# Patient Record
Sex: Male | Born: 1995 | Race: White | Hispanic: No | Marital: Single | Smoking: Never smoker
Health system: Southern US, Community
[De-identification: ages and names within clinical notes are randomized; demographics above are authoritative.]

## PROBLEM LIST (undated history)

## (undated) DIAGNOSIS — B019 Varicella without complication: Secondary | ICD-10-CM

## (undated) DIAGNOSIS — F840 Autistic disorder: Secondary | ICD-10-CM

## (undated) HISTORY — PX: APPENDECTOMY: SHX54

## (undated) HISTORY — DX: Varicella without complication: B01.9

## (undated) HISTORY — PX: MYRINGOTOMY: SHX2060

## (undated) HISTORY — DX: Autistic disorder: F84.0

---
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
## 2015-11-01 ENCOUNTER — Ambulatory Visit: Payer: 59

## 2015-11-01 ENCOUNTER — Ambulatory Visit (INDEPENDENT_AMBULATORY_CARE_PROVIDER_SITE_OTHER): Payer: 59

## 2015-11-01 DIAGNOSIS — Z23 Encounter for immunization: Secondary | ICD-10-CM | POA: Diagnosis not present

## 2016-06-29 ENCOUNTER — Ambulatory Visit (INDEPENDENT_AMBULATORY_CARE_PROVIDER_SITE_OTHER): Payer: 59 | Admitting: Psychology

## 2016-06-29 DIAGNOSIS — F840 Autistic disorder: Secondary | ICD-10-CM

## 2016-06-29 DIAGNOSIS — F4322 Adjustment disorder with anxiety: Secondary | ICD-10-CM | POA: Diagnosis not present

## 2016-07-17 ENCOUNTER — Ambulatory Visit (INDEPENDENT_AMBULATORY_CARE_PROVIDER_SITE_OTHER): Payer: 59 | Admitting: Psychology

## 2016-07-17 DIAGNOSIS — F4322 Adjustment disorder with anxiety: Secondary | ICD-10-CM

## 2016-07-17 DIAGNOSIS — F840 Autistic disorder: Secondary | ICD-10-CM

## 2016-08-02 ENCOUNTER — Ambulatory Visit (INDEPENDENT_AMBULATORY_CARE_PROVIDER_SITE_OTHER): Payer: 59 | Admitting: Psychology

## 2016-08-02 DIAGNOSIS — F840 Autistic disorder: Secondary | ICD-10-CM

## 2016-08-02 DIAGNOSIS — F4322 Adjustment disorder with anxiety: Secondary | ICD-10-CM

## 2016-08-16 ENCOUNTER — Ambulatory Visit (INDEPENDENT_AMBULATORY_CARE_PROVIDER_SITE_OTHER): Payer: 59 | Admitting: Psychology

## 2016-08-16 DIAGNOSIS — F840 Autistic disorder: Secondary | ICD-10-CM

## 2016-08-16 DIAGNOSIS — F4322 Adjustment disorder with anxiety: Secondary | ICD-10-CM | POA: Diagnosis not present

## 2016-08-22 ENCOUNTER — Ambulatory Visit (INDEPENDENT_AMBULATORY_CARE_PROVIDER_SITE_OTHER): Payer: 59 | Admitting: Psychology

## 2016-08-22 DIAGNOSIS — F840 Autistic disorder: Secondary | ICD-10-CM | POA: Diagnosis not present

## 2016-08-22 DIAGNOSIS — F4323 Adjustment disorder with mixed anxiety and depressed mood: Secondary | ICD-10-CM

## 2016-08-30 ENCOUNTER — Ambulatory Visit (INDEPENDENT_AMBULATORY_CARE_PROVIDER_SITE_OTHER): Payer: 59 | Admitting: Psychology

## 2016-08-30 DIAGNOSIS — F840 Autistic disorder: Secondary | ICD-10-CM

## 2016-09-13 ENCOUNTER — Ambulatory Visit: Payer: Self-pay | Admitting: Psychology

## 2016-09-18 ENCOUNTER — Other Ambulatory Visit (INDEPENDENT_AMBULATORY_CARE_PROVIDER_SITE_OTHER): Payer: 59

## 2016-09-18 ENCOUNTER — Ambulatory Visit (INDEPENDENT_AMBULATORY_CARE_PROVIDER_SITE_OTHER): Payer: 59 | Admitting: Family

## 2016-09-18 ENCOUNTER — Encounter: Payer: Self-pay | Admitting: Family

## 2016-09-18 VITALS — BP 116/82 | HR 81 | Ht 69.0 in | Wt 180.4 lb

## 2016-09-18 DIAGNOSIS — Z0000 Encounter for general adult medical examination without abnormal findings: Secondary | ICD-10-CM

## 2016-09-18 LAB — LIPID PANEL
CHOL/HDL RATIO: 4
Cholesterol: 138 mg/dL (ref 0–200)
HDL: 32.6 mg/dL — ABNORMAL LOW (ref >39.00)
LDL CALC: 87 mg/dL (ref 0–99)
NONHDL: 105.28
TRIGLYCERIDES: 90 mg/dL (ref 0.0–149.0)
VLDL: 18 mg/dL (ref 0.0–40.0)

## 2016-09-18 LAB — COMPREHENSIVE METABOLIC PANEL
ALT: 21 U/L (ref 0–53)
AST: 17 U/L (ref 0–37)
Albumin: 4.7 g/dL (ref 3.5–5.2)
Alkaline Phosphatase: 66 U/L (ref 39–117)
BUN: 10 mg/dL (ref 6–23)
CALCIUM: 9.8 mg/dL (ref 8.4–10.5)
CHLORIDE: 102 meq/L (ref 96–112)
CO2: 29 meq/L (ref 19–32)
CREATININE: 0.96 mg/dL (ref 0.40–1.50)
GFR: 104.94 mL/min (ref >60.00)
GLUCOSE: 97 mg/dL (ref 70–99)
Potassium: 4.6 mEq/L (ref 3.5–5.1)
SODIUM: 137 meq/L (ref 135–145)
Total Bilirubin: 0.7 mg/dL (ref 0.2–1.2)
Total Protein: 7.2 g/dL (ref 6.0–8.3)

## 2016-09-18 LAB — CBC
HCT: 49.9 % (ref 39.0–52.0)
Hemoglobin: 17.5 g/dL — ABNORMAL HIGH (ref 13.0–17.0)
MCHC: 35.1 g/dL (ref 30.0–36.0)
MCV: 81.8 fl (ref 78.0–100.0)
PLATELETS: 215 10*3/uL (ref 150.0–400.0)
RBC: 6.1 Mil/uL — ABNORMAL HIGH (ref 4.22–5.81)
RDW: 13.6 % (ref 11.5–15.5)
WBC: 6.7 10*3/uL (ref 4.0–10.5)

## 2016-09-18 NOTE — Patient Instructions (Signed)
Thank you for choosing [facility].  SUMMARY AND INSTRUCTIONS:  Continue to work on physical activity.   Labs:  Please stop by the lab on the lower level of the building for your blood work. Your results will be released to MyChart (or called to you) after review, usually within 72 hours after test completion. If any changes need to be made, you will be notified at that same time.  1.) The lab is open from 7:30am to 5:30 pm Monday-Friday 2.) No appointment is necessary 3.) Fasting (if needed) is 6-8 hours after food and drink; black coffee and water are okay   Follow up:  If your symptoms worsen or fail to improve, please contact our office for further instruction, or in case of emergency go directly to the emergency room at the closest medical facility.     Health Maintenance, Male A healthy lifestyle and preventive care is important for your health and wellness. Ask your health care provider about what schedule of regular examinations is right for you. What should I know about weight and diet? Eat a Healthy Diet  Eat plenty of vegetables, fruits, whole grains, low-fat dairy products, and lean protein.  Do not eat a lot of foods high in solid fats, added sugars, or salt.  Maintain a Healthy Weight Regular exercise can help you achieve or maintain a healthy weight. You should:  Do at least 150 minutes of exercise each week. The exercise should increase your heart rate and make you sweat (moderate-intensity exercise).  Do strength-training exercises at least twice a week.  Watch Your Levels of Cholesterol and Blood Lipids  Have your blood tested for lipids and cholesterol every 5 years starting at 21 years of age. If you are at high risk for heart disease, you should start having your blood tested when you are 21 years old. You may need to have your cholesterol levels checked more often if: ? Your lipid or cholesterol levels are high. ? You are older than 21 years of  age. ? You are at high risk for heart disease.  What should I know about cancer screening? Many types of cancers can be detected early and may often be prevented. Lung Cancer  You should be screened every year for lung cancer if: ? You are a current smoker who has smoked for at least 30 years. ? You are a former smoker who has quit within the past 15 years.  Talk to your health care provider about your screening options, when you should start screening, and how often you should be screened.  Colorectal Cancer  Routine colorectal cancer screening usually begins at 21 years of age and should be repeated every 5-10 years until you are 21 years old. You may need to be screened more often if early forms of precancerous polyps or small growths are found. Your health care provider may recommend screening at an earlier age if you have risk factors for colon cancer.  Your health care provider may recommend using home test kits to check for hidden blood in the stool.  A small camera at the end of a tube can be used to examine your colon (sigmoidoscopy or colonoscopy). This checks for the earliest forms of colorectal cancer.  Prostate and Testicular Cancer  Depending on your age and overall health, your health care provider may do certain tests to screen for prostate and testicular cancer.  Talk to your health care provider about any symptoms or concerns you have about testicular  or prostate cancer.  Skin Cancer  Check your skin from head to toe regularly.  Tell your health care provider about any new moles or changes in moles, especially if: ? There is a change in a mole's size, shape, or color. ? You have a mole that is larger than a pencil eraser.  Always use sunscreen. Apply sunscreen liberally and repeat throughout the day.  Protect yourself by wearing long sleeves, pants, a wide-brimmed hat, and sunglasses when outside.  What should I know about heart disease, diabetes, and high  blood pressure?  If you are 34-56 years of age, have your blood pressure checked every 3-5 years. If you are 29 years of age or older, have your blood pressure checked every year. You should have your blood pressure measured twice-once when you are at a hospital or clinic, and once when you are not at a hospital or clinic. Record the average of the two measurements. To check your blood pressure when you are not at a hospital or clinic, you can use: ? An automated blood pressure machine at a pharmacy. ? A home blood pressure monitor.  Talk to your health care provider about your target blood pressure.  If you are between 33-74 years old, ask your health care provider if you should take aspirin to prevent heart disease.  Have regular diabetes screenings by checking your fasting blood sugar level. ? If you are at a normal weight and have a low risk for diabetes, have this test once every three years after the age of 39. ? If you are overweight and have a high risk for diabetes, consider being tested at a younger age or more often.  A one-time screening for abdominal aortic aneurysm (AAA) by ultrasound is recommended for men aged 34-75 years who are current or former smokers. What should I know about preventing infection? Hepatitis B If you have a higher risk for hepatitis B, you should be screened for this virus. Talk with your health care provider to find out if you are at risk for hepatitis B infection. Hepatitis C Blood testing is recommended for:  Everyone born from [birth year] through 1965.  Anyone with known risk factors for hepatitis C.  Sexually Transmitted Diseases (STDs)  You should be screened each year for STDs including gonorrhea and chlamydia if: ? You are sexually active and are younger than 21 years of age. ? You are older than 21 years of age and your health care provider tells you that you are at risk for this type of infection. ? Your sexual activity has changed since you were  last screened and you are at an increased risk for chlamydia or gonorrhea. Ask your health care provider if you are at risk.  Talk with your health care provider about whether you are at high risk of being infected with HIV. Your health care provider may recommend a prescription medicine to help prevent HIV infection.  What else can I do?  Schedule regular health, dental, and eye exams.  Stay current with your vaccines (immunizations).  Do not use any tobacco products, such as cigarettes, chewing tobacco, and e-cigarettes. If you need help quitting, ask your health care provider.  Limit alcohol intake to no more than 2 drinks per day. One drink equals 12 ounces of beer, 5 ounces of wine, or 1 ounces of hard liquor.  Do not use street drugs.  Do not share needles.  Ask your health care provider for help if you need support  or information about quitting drugs.  Tell your health care provider if you often feel depressed.  Tell your health care provider if you have ever been abused or do not feel safe at home. This information is not intended to replace advice given to you by your health care provider. Make sure you discuss any questions you have with your health care provider. Document Released: 09/23/2007 Document Revised: 11/24/2015 Document Reviewed: 12/29/2014 Elsevier Interactive Patient Education  [employer].

## 2016-09-18 NOTE — Assessment & Plan Note (Signed)
1) Anticipatory Guidance: Discussed importance of wearing a seatbelt while driving and not texting while driving; changing batteries in smoke detector at least once annually; wearing suntan lotion when outside; eating a balanced and moderate diet; getting physical activity at least 30 minutes per day.  2) Immunizations / Screenings / Labs:   All immunizations are up-to-date per recommendations. Due for a vision exam encouraged to be completed independently. All other screenings are up-to-date per recommendations. Obtain CBC, CMET, and lipid profile.    Overall well exam with minimal risk factors for cardiovascular disease noted on physical exam. Exercises on a regular basis and is of good body weight.  Recommend optimizing nutrition through an intake that is moderate, balance, and varied. Continue other healthy lifestyle behaviors and choices. Follow-up prevention exam in 1 year. Follow-up office visit pending blood work as needed.

## 2016-09-18 NOTE — Progress Notes (Signed)
Subjective:    Patient ID: Nathan Lawson, male    DOB: 04-26-1995, 21 y.o.   MRN: [id number]  Chief Complaint  Patient presents with  . Annual Exam    HPI:  Nathan Lawson is a 21 y.o. male who presents today for an annual wellness visit.   1) Health Maintenance -   Diet - Averages about 2-3 meals per day consisting of a regular diet; Caffeine intake 1-2 cups per day  Exercise - Physical labor and working to increase exercise.   Wt Readings from Last 3 Encounters:  09/18/16 180 lb 6.4 oz (81.8 kg)  11/02/14 179 lb (81.2 kg) (81 %, Z= 0.89)*  08/20/14 180 lb 3.2 oz (81.7 kg) (83 %, Z= 0.95)*   * Growth percentiles are based on CDC 2-20 Years data.     2) Preventative Exams / Immunizations:  Dental -- Up to date  Vision -- Due for exam    Health Maintenance  Topic Date Due  . HIV Screening  07/27/2010  . INFLUENZA VACCINE  11/08/2016  . TETANUS/TDAP  01/05/2022    Immunization History  Administered Date(s) Administered  . DTaP 09/28/1995, 01/23/1996, 03/15/1996, 02/23/1997, 11/21/2000  . HPV Quadrivalent 08/20/2009, 09/08/2010, 09/07/2011  . Hepatitis A 05/19/2005, 11/17/2005  . Hepatitis B, adult 11/02/2014, 12/01/2014, 11/01/2015  . HiB (PRP-OMP) 09/28/1995, 01/23/1996, 03/15/1996, 02/23/1997  . IPV 11/05/1995, 12/19/1995, 11/21/2000  . Influenza Nasal 05/19/2005  . Influenza-Unspecified 03/31/2015  . MMR 10/27/1996, 11/21/2000  . Meningococcal Conjugate 07/03/2007  . Meningococcal Polysaccharide 09/07/2011  . Tdap 01/06/2012  . Varicella 10/27/1996, 05/19/2005     No Known Allergies   Outpatient Medications Prior to Visit  Medication Sig Dispense Refill  . azithromycin (ZITHROMAX) 250 MG tablet Take 2 tablets for 1 day and then 1 tablet daily for 4 days. 6 tablet 0   No facility-administered medications prior to visit.      Past Medical History:  Diagnosis Date  . Autism   . Chicken pox      Past Surgical History:  Procedure  Laterality Date  . APPENDECTOMY    . MYRINGOTOMY       Family History  Problem Relation Age of Onset  . Depression Mother   . Healthy Father   . Parkinson's disease Paternal Grandfather      Social History   Social History  . Marital status: Single    Spouse name: N/A  . Number of children: 0  . Years of education: 12   Occupational History  . Student    Social History Main Topics  . Smoking status: Never Smoker  . Smokeless tobacco: Never Used  . Alcohol use No  . Drug use: No  . Sexual activity: Not on file   Other Topics Concern  . Not on file   Social History Narrative   Fun: Gaming, reading   Denies religious beliefs effecting health care.       Review of Systems  Constitutional: Denies fever, chills, fatigue, or significant weight gain/loss. HENT: Head: Denies headache or neck pain Ears: Denies changes in hearing, ringing in ears, earache, drainage Nose: Denies discharge, stuffiness, itching, nosebleed, sinus pain Throat: Denies sore throat, hoarseness, dry mouth, sores, thrush Eyes: Denies loss/changes in vision, pain, redness, blurry/double vision, flashing lights Cardiovascular: Denies chest pain/discomfort, tightness, palpitations, shortness of breath with activity, difficulty lying down, swelling, sudden awakening with shortness of breath Respiratory: Denies shortness of breath, cough, sputum production, wheezing Gastrointestinal: Denies dysphasia, heartburn, change in appetite, nausea, change  in bowel habits, rectal bleeding, constipation, diarrhea, yellow skin or eyes Genitourinary: Denies frequency, urgency, burning/pain, blood in urine, incontinence, change in urinary strength. Musculoskeletal: Denies muscle/joint pain, stiffness, back pain, redness or swelling of joints, trauma Skin: Denies rashes, lumps, itching, dryness, color changes, or hair/nail changes Neurological: Denies dizziness, fainting, seizures, weakness, numbness, tingling,  tremor Psychiatric - Denies nervousness, stress, depression or memory loss Endocrine: Denies heat or cold intolerance, sweating, frequent urination, excessive thirst, changes in appetite Hematologic: Denies ease of bruising or bleeding     Objective:     BP 116/82   Pulse 81   Ht 5' 9"  (1.753 m)   Wt 180 lb 6.4 oz (81.8 kg)   SpO2 98%   BMI 26.64 kg/m  Nursing note and vital signs reviewed.  Physical Exam  Constitutional: He is oriented to person, place, and time. He appears well-developed and well-nourished. No distress.  HENT:  Head: Normocephalic.  Right Ear: Hearing, tympanic membrane, external ear and ear canal normal.  Left Ear: Hearing, tympanic membrane, external ear and ear canal normal.  Nose: Nose normal.  Mouth/Throat: Uvula is midline, oropharynx is clear and moist and mucous membranes are normal.  Eyes: Conjunctivae and EOM are normal. Pupils are equal, round, and reactive to light.  Neck: Neck supple. No JVD present. No tracheal deviation present. No thyromegaly present.  Cardiovascular: Normal rate, regular rhythm, normal heart sounds and intact distal pulses.   Pulmonary/Chest: Effort normal and breath sounds normal.  Abdominal: Soft. Bowel sounds are normal. He exhibits no distension and no mass. There is no tenderness. There is no rebound and no guarding.  Musculoskeletal: Normal range of motion. He exhibits no edema or tenderness.  Lymphadenopathy:    He has no cervical adenopathy.  Neurological: He is alert and oriented to person, place, and time. He has normal reflexes. No cranial nerve deficit. He exhibits normal muscle tone. Coordination normal.  Skin: Skin is warm and dry.  Psychiatric: He has a normal mood and affect. His behavior is normal. Judgment and thought content normal.       Assessment & Plan:   Problem List Items Addressed This Visit      Other   Routine general medical examination at a health care facility - Primary    1) Anticipatory  Guidance: Discussed importance of wearing a seatbelt while driving and not texting while driving; changing batteries in smoke detector at least once annually; wearing suntan lotion when outside; eating a balanced and moderate diet; getting physical activity at least 30 minutes per day.  2) Immunizations / Screenings / Labs:   All immunizations are up-to-date per recommendations. Due for a vision exam encouraged to be completed independently. All other screenings are up-to-date per recommendations. Obtain CBC, CMET, and lipid profile.    Overall well exam with minimal risk factors for cardiovascular disease noted on physical exam. Exercises on a regular basis and is of good body weight.  Recommend optimizing nutrition through an intake that is moderate, balance, and varied. Continue other healthy lifestyle behaviors and choices. Follow-up prevention exam in 1 year. Follow-up office visit pending blood work as needed.       Relevant Orders   CBC   Comprehensive metabolic panel   Lipid panel       I have discontinued Mr. Castoro's azithromycin.   Follow-up: Return in about 1 year (around 09/18/2017), or if symptoms worsen or fail to improve.   Mauricio Po, FNP

## 2016-09-27 ENCOUNTER — Ambulatory Visit (INDEPENDENT_AMBULATORY_CARE_PROVIDER_SITE_OTHER): Payer: 59 | Admitting: Psychology

## 2016-09-27 DIAGNOSIS — F840 Autistic disorder: Secondary | ICD-10-CM | POA: Diagnosis not present

## 2016-10-25 ENCOUNTER — Ambulatory Visit: Payer: 59 | Admitting: Psychology

## 2016-11-08 ENCOUNTER — Ambulatory Visit (INDEPENDENT_AMBULATORY_CARE_PROVIDER_SITE_OTHER): Payer: 59 | Admitting: Psychology

## 2016-11-08 DIAGNOSIS — F840 Autistic disorder: Secondary | ICD-10-CM

## 2016-11-22 ENCOUNTER — Ambulatory Visit (INDEPENDENT_AMBULATORY_CARE_PROVIDER_SITE_OTHER): Payer: 59 | Admitting: Psychology

## 2016-11-22 DIAGNOSIS — F840 Autistic disorder: Secondary | ICD-10-CM | POA: Diagnosis not present

## 2016-12-06 ENCOUNTER — Ambulatory Visit (INDEPENDENT_AMBULATORY_CARE_PROVIDER_SITE_OTHER): Payer: 59 | Admitting: Psychology

## 2016-12-06 DIAGNOSIS — F840 Autistic disorder: Secondary | ICD-10-CM | POA: Diagnosis not present

## 2017-01-03 ENCOUNTER — Ambulatory Visit (INDEPENDENT_AMBULATORY_CARE_PROVIDER_SITE_OTHER): Payer: 59 | Admitting: Psychology

## 2017-01-03 DIAGNOSIS — F840 Autistic disorder: Secondary | ICD-10-CM

## 2017-01-31 ENCOUNTER — Ambulatory Visit: Payer: Self-pay | Admitting: Psychology

## 2017-02-28 ENCOUNTER — Ambulatory Visit (INDEPENDENT_AMBULATORY_CARE_PROVIDER_SITE_OTHER): Payer: 59 | Admitting: Psychology

## 2017-02-28 DIAGNOSIS — F840 Autistic disorder: Secondary | ICD-10-CM

## 2017-03-28 ENCOUNTER — Ambulatory Visit: Payer: 59 | Admitting: Psychology

## 2017-04-25 ENCOUNTER — Ambulatory Visit: Payer: 59 | Admitting: Psychology

## 2017-05-23 ENCOUNTER — Ambulatory Visit: Payer: 59 | Admitting: Psychology

## 2017-05-24 ENCOUNTER — Ambulatory Visit (INDEPENDENT_AMBULATORY_CARE_PROVIDER_SITE_OTHER): Payer: 59 | Admitting: Psychology

## 2017-05-24 DIAGNOSIS — F840 Autistic disorder: Secondary | ICD-10-CM | POA: Diagnosis not present

## 2017-05-31 ENCOUNTER — Ambulatory Visit: Payer: 59 | Admitting: Psychology

## 2017-06-01 ENCOUNTER — Ambulatory Visit: Payer: 59 | Admitting: Psychology

## 2017-07-02 ENCOUNTER — Ambulatory Visit (INDEPENDENT_AMBULATORY_CARE_PROVIDER_SITE_OTHER): Payer: 59 | Admitting: Psychology

## 2017-07-02 DIAGNOSIS — F900 Attention-deficit hyperactivity disorder, predominantly inattentive type: Secondary | ICD-10-CM | POA: Diagnosis not present

## 2017-07-02 DIAGNOSIS — F840 Autistic disorder: Secondary | ICD-10-CM | POA: Diagnosis not present

## 2017-07-03 ENCOUNTER — Ambulatory Visit (INDEPENDENT_AMBULATORY_CARE_PROVIDER_SITE_OTHER): Payer: 59 | Admitting: Psychology

## 2017-07-03 DIAGNOSIS — F812 Mathematics disorder: Secondary | ICD-10-CM

## 2017-07-03 DIAGNOSIS — F902 Attention-deficit hyperactivity disorder, combined type: Secondary | ICD-10-CM | POA: Diagnosis not present

## 2017-07-03 DIAGNOSIS — F840 Autistic disorder: Secondary | ICD-10-CM | POA: Diagnosis not present

## 2017-07-04 ENCOUNTER — Ambulatory Visit (INDEPENDENT_AMBULATORY_CARE_PROVIDER_SITE_OTHER): Payer: 59 | Admitting: Psychology

## 2017-07-04 DIAGNOSIS — F902 Attention-deficit hyperactivity disorder, combined type: Secondary | ICD-10-CM | POA: Diagnosis not present

## 2017-07-04 DIAGNOSIS — F840 Autistic disorder: Secondary | ICD-10-CM | POA: Diagnosis not present

## 2017-07-04 DIAGNOSIS — F812 Mathematics disorder: Secondary | ICD-10-CM | POA: Diagnosis not present

## 2017-08-02 ENCOUNTER — Ambulatory Visit (INDEPENDENT_AMBULATORY_CARE_PROVIDER_SITE_OTHER): Payer: 59 | Admitting: Psychology

## 2017-08-02 DIAGNOSIS — F840 Autistic disorder: Secondary | ICD-10-CM

## 2017-08-02 DIAGNOSIS — F902 Attention-deficit hyperactivity disorder, combined type: Secondary | ICD-10-CM

## 2017-08-02 DIAGNOSIS — F812 Mathematics disorder: Secondary | ICD-10-CM

## 2017-08-30 ENCOUNTER — Ambulatory Visit (INDEPENDENT_AMBULATORY_CARE_PROVIDER_SITE_OTHER): Payer: 59 | Admitting: Psychology

## 2017-08-30 DIAGNOSIS — F840 Autistic disorder: Secondary | ICD-10-CM

## 2017-08-30 DIAGNOSIS — F812 Mathematics disorder: Secondary | ICD-10-CM

## 2017-08-30 DIAGNOSIS — F902 Attention-deficit hyperactivity disorder, combined type: Secondary | ICD-10-CM

## 2017-09-27 ENCOUNTER — Ambulatory Visit (INDEPENDENT_AMBULATORY_CARE_PROVIDER_SITE_OTHER): Payer: 59 | Admitting: Psychology

## 2017-09-27 DIAGNOSIS — F812 Mathematics disorder: Secondary | ICD-10-CM

## 2017-09-27 DIAGNOSIS — F902 Attention-deficit hyperactivity disorder, combined type: Secondary | ICD-10-CM | POA: Diagnosis not present

## 2017-09-27 DIAGNOSIS — F840 Autistic disorder: Secondary | ICD-10-CM

## 2017-10-25 ENCOUNTER — Ambulatory Visit: Payer: 59 | Admitting: Psychology

## 2017-11-22 ENCOUNTER — Ambulatory Visit (INDEPENDENT_AMBULATORY_CARE_PROVIDER_SITE_OTHER): Payer: 59 | Admitting: Psychology

## 2017-11-22 DIAGNOSIS — F840 Autistic disorder: Secondary | ICD-10-CM

## 2017-11-22 DIAGNOSIS — F812 Mathematics disorder: Secondary | ICD-10-CM | POA: Diagnosis not present

## 2017-11-22 DIAGNOSIS — F902 Attention-deficit hyperactivity disorder, combined type: Secondary | ICD-10-CM

## 2018-02-14 ENCOUNTER — Ambulatory Visit (INDEPENDENT_AMBULATORY_CARE_PROVIDER_SITE_OTHER): Payer: 59 | Admitting: Psychology

## 2018-02-14 DIAGNOSIS — F812 Mathematics disorder: Secondary | ICD-10-CM | POA: Diagnosis not present

## 2018-02-14 DIAGNOSIS — F901 Attention-deficit hyperactivity disorder, predominantly hyperactive type: Secondary | ICD-10-CM

## 2018-02-14 DIAGNOSIS — F840 Autistic disorder: Secondary | ICD-10-CM | POA: Diagnosis not present

## 2018-03-28 ENCOUNTER — Ambulatory Visit (INDEPENDENT_AMBULATORY_CARE_PROVIDER_SITE_OTHER): Payer: 59 | Admitting: Psychology

## 2018-03-28 DIAGNOSIS — F902 Attention-deficit hyperactivity disorder, combined type: Secondary | ICD-10-CM | POA: Diagnosis not present

## 2018-03-28 DIAGNOSIS — F840 Autistic disorder: Secondary | ICD-10-CM

## 2018-03-28 DIAGNOSIS — F812 Mathematics disorder: Secondary | ICD-10-CM

## 2019-11-26 ENCOUNTER — Ambulatory Visit (INDEPENDENT_AMBULATORY_CARE_PROVIDER_SITE_OTHER): Payer: Self-pay | Admitting: Psychology

## 2019-11-26 DIAGNOSIS — F902 Attention-deficit hyperactivity disorder, combined type: Secondary | ICD-10-CM

## 2019-11-26 DIAGNOSIS — F320 Major depressive disorder, single episode, mild: Secondary | ICD-10-CM

## 2019-11-26 DIAGNOSIS — F840 Autistic disorder: Secondary | ICD-10-CM

## 2019-12-09 ENCOUNTER — Ambulatory Visit (INDEPENDENT_AMBULATORY_CARE_PROVIDER_SITE_OTHER): Payer: Self-pay | Admitting: Psychology

## 2019-12-09 DIAGNOSIS — F840 Autistic disorder: Secondary | ICD-10-CM

## 2019-12-09 DIAGNOSIS — F320 Major depressive disorder, single episode, mild: Secondary | ICD-10-CM

## 2019-12-09 DIAGNOSIS — F902 Attention-deficit hyperactivity disorder, combined type: Secondary | ICD-10-CM

## 2021-06-16 DIAGNOSIS — M79671 Pain in right foot: Secondary | ICD-10-CM | POA: Diagnosis not present

## 2021-12-25 DIAGNOSIS — H6121 Impacted cerumen, right ear: Secondary | ICD-10-CM | POA: Diagnosis not present

## 2022-04-06 DIAGNOSIS — H6691 Otitis media, unspecified, right ear: Secondary | ICD-10-CM | POA: Diagnosis not present

## 2022-05-12 DIAGNOSIS — H66002 Acute suppurative otitis media without spontaneous rupture of ear drum, left ear: Secondary | ICD-10-CM | POA: Diagnosis not present

## 2022-11-08 DIAGNOSIS — M1990 Unspecified osteoarthritis, unspecified site: Secondary | ICD-10-CM | POA: Diagnosis not present

## 2022-11-08 DIAGNOSIS — J309 Allergic rhinitis, unspecified: Secondary | ICD-10-CM | POA: Diagnosis not present

## 2022-11-14 DIAGNOSIS — Z13220 Encounter for screening for lipoid disorders: Secondary | ICD-10-CM | POA: Diagnosis not present

## 2022-11-14 DIAGNOSIS — Z114 Encounter for screening for human immunodeficiency virus [HIV]: Secondary | ICD-10-CM | POA: Diagnosis not present

## 2022-11-14 DIAGNOSIS — Z0000 Encounter for general adult medical examination without abnormal findings: Secondary | ICD-10-CM | POA: Diagnosis not present

## 2022-11-29 DIAGNOSIS — Z7185 Encounter for immunization safety counseling: Secondary | ICD-10-CM | POA: Diagnosis not present

## 2022-11-29 DIAGNOSIS — Z0000 Encounter for general adult medical examination without abnormal findings: Secondary | ICD-10-CM | POA: Diagnosis not present

## 2022-11-29 DIAGNOSIS — Z23 Encounter for immunization: Secondary | ICD-10-CM | POA: Diagnosis not present

## 2022-11-29 DIAGNOSIS — I10 Essential (primary) hypertension: Secondary | ICD-10-CM | POA: Diagnosis not present

## 2022-12-20 DIAGNOSIS — I10 Essential (primary) hypertension: Secondary | ICD-10-CM | POA: Diagnosis not present

## 2023-01-24 DIAGNOSIS — I10 Essential (primary) hypertension: Secondary | ICD-10-CM | POA: Diagnosis not present

## 2023-03-16 DIAGNOSIS — I10 Essential (primary) hypertension: Secondary | ICD-10-CM | POA: Diagnosis not present

## 2023-04-25 DIAGNOSIS — J309 Allergic rhinitis, unspecified: Secondary | ICD-10-CM | POA: Diagnosis not present

## 2023-04-25 DIAGNOSIS — K219 Gastro-esophageal reflux disease without esophagitis: Secondary | ICD-10-CM | POA: Diagnosis not present

## 2023-04-25 DIAGNOSIS — R079 Chest pain, unspecified: Secondary | ICD-10-CM | POA: Diagnosis not present

## 2023-04-25 DIAGNOSIS — I10 Essential (primary) hypertension: Secondary | ICD-10-CM | POA: Diagnosis not present

## 2023-06-04 DIAGNOSIS — I10 Essential (primary) hypertension: Secondary | ICD-10-CM | POA: Diagnosis not present

## 2023-06-04 DIAGNOSIS — K219 Gastro-esophageal reflux disease without esophagitis: Secondary | ICD-10-CM | POA: Diagnosis not present

## 2023-06-04 DIAGNOSIS — F840 Autistic disorder: Secondary | ICD-10-CM | POA: Diagnosis not present

## 2023-06-04 DIAGNOSIS — R079 Chest pain, unspecified: Secondary | ICD-10-CM | POA: Diagnosis not present

## 2023-07-25 DIAGNOSIS — I10 Essential (primary) hypertension: Secondary | ICD-10-CM | POA: Diagnosis not present

## 2023-07-25 DIAGNOSIS — K219 Gastro-esophageal reflux disease without esophagitis: Secondary | ICD-10-CM | POA: Diagnosis not present

## 2023-07-25 DIAGNOSIS — R0789 Other chest pain: Secondary | ICD-10-CM | POA: Diagnosis not present

## 2023-09-04 DIAGNOSIS — I10 Essential (primary) hypertension: Secondary | ICD-10-CM | POA: Diagnosis not present

## 2023-09-04 DIAGNOSIS — K219 Gastro-esophageal reflux disease without esophagitis: Secondary | ICD-10-CM | POA: Diagnosis not present

## 2023-09-04 DIAGNOSIS — J309 Allergic rhinitis, unspecified: Secondary | ICD-10-CM | POA: Diagnosis not present

## 2023-10-01 DIAGNOSIS — F840 Autistic disorder: Secondary | ICD-10-CM | POA: Diagnosis not present

## 2023-10-01 DIAGNOSIS — K219 Gastro-esophageal reflux disease without esophagitis: Secondary | ICD-10-CM | POA: Diagnosis not present

## 2023-10-01 DIAGNOSIS — I10 Essential (primary) hypertension: Secondary | ICD-10-CM | POA: Diagnosis not present

## 2023-11-13 DIAGNOSIS — K219 Gastro-esophageal reflux disease without esophagitis: Secondary | ICD-10-CM | POA: Diagnosis not present

## 2023-11-13 DIAGNOSIS — R142 Eructation: Secondary | ICD-10-CM | POA: Diagnosis not present

## 2023-11-13 DIAGNOSIS — R1013 Epigastric pain: Secondary | ICD-10-CM | POA: Diagnosis not present

## 2023-11-13 DIAGNOSIS — E669 Obesity, unspecified: Secondary | ICD-10-CM | POA: Diagnosis not present

## 2023-12-03 DIAGNOSIS — Z0000 Encounter for general adult medical examination without abnormal findings: Secondary | ICD-10-CM | POA: Diagnosis not present

## 2023-12-03 DIAGNOSIS — Z13220 Encounter for screening for lipoid disorders: Secondary | ICD-10-CM | POA: Diagnosis not present

## 2023-12-06 DIAGNOSIS — Z0000 Encounter for general adult medical examination without abnormal findings: Secondary | ICD-10-CM | POA: Diagnosis not present

## 2024-01-03 DIAGNOSIS — I10 Essential (primary) hypertension: Secondary | ICD-10-CM | POA: Diagnosis not present

## 2024-01-03 DIAGNOSIS — M7650 Patellar tendinitis, unspecified knee: Secondary | ICD-10-CM | POA: Diagnosis not present

## 2024-01-03 DIAGNOSIS — E663 Overweight: Secondary | ICD-10-CM | POA: Diagnosis not present
